# Patient Record
Sex: Male | Born: 1970 | Race: White | Hispanic: No | Marital: Single | Smoking: Former smoker
Health system: Southern US, Community
[De-identification: ages and names within clinical notes are randomized; demographics above are authoritative.]

## PROBLEM LIST (undated history)

## (undated) DIAGNOSIS — I10 Essential (primary) hypertension: Secondary | ICD-10-CM

## (undated) HISTORY — DX: Essential (primary) hypertension: I10

## (undated) HISTORY — PX: OTHER SURGICAL HISTORY: SHX169

---

## 2002-09-10 ENCOUNTER — Emergency Department (HOSPITAL_COMMUNITY): Admission: EM | Admit: 2002-09-10 | Discharge: 2002-09-10 | Payer: Self-pay | Admitting: Emergency Medicine

## 2002-09-10 ENCOUNTER — Encounter: Payer: Self-pay | Admitting: Emergency Medicine

## 2004-08-09 ENCOUNTER — Emergency Department (HOSPITAL_COMMUNITY): Admission: EM | Admit: 2004-08-09 | Discharge: 2004-08-09 | Payer: Self-pay | Admitting: Family Medicine

## 2018-01-07 ENCOUNTER — Other Ambulatory Visit: Payer: Self-pay | Admitting: Internal Medicine

## 2018-01-07 DIAGNOSIS — Z13220 Encounter for screening for lipoid disorders: Secondary | ICD-10-CM

## 2018-01-07 DIAGNOSIS — Z0000 Encounter for general adult medical examination without abnormal findings: Secondary | ICD-10-CM

## 2018-01-14 ENCOUNTER — Other Ambulatory Visit: Payer: Self-pay | Admitting: Internal Medicine

## 2018-01-16 ENCOUNTER — Telehealth: Payer: Self-pay | Admitting: Internal Medicine

## 2018-01-16 NOTE — Telephone Encounter (Signed)
Had apt 2 days ago for New pt fasting labs with upcoming CPE/ No working # in [employer]. Pt has not come for labs. CPE is scheduled for May.

## 2018-01-20 ENCOUNTER — Encounter: Payer: Self-pay | Admitting: Internal Medicine

## 2018-02-20 ENCOUNTER — Other Ambulatory Visit: Payer: Self-pay

## 2018-02-20 DIAGNOSIS — Z0000 Encounter for general adult medical examination without abnormal findings: Secondary | ICD-10-CM

## 2018-03-10 ENCOUNTER — Other Ambulatory Visit: Payer: Managed Care, Other (non HMO) | Admitting: Internal Medicine

## 2018-03-10 DIAGNOSIS — Z0000 Encounter for general adult medical examination without abnormal findings: Secondary | ICD-10-CM

## 2018-03-10 DIAGNOSIS — Z13220 Encounter for screening for lipoid disorders: Secondary | ICD-10-CM

## 2018-03-10 LAB — COMPLETE METABOLIC PANEL WITH GFR
AG Ratio: 1.5 (calc) (ref 1.0–2.5)
ALT: 29 U/L (ref 9–46)
AST: 21 U/L (ref 10–40)
Albumin: 4 g/dL (ref 3.6–5.1)
Alkaline phosphatase (APISO): 53 U/L (ref 40–115)
BUN: 23 mg/dL (ref 7–25)
CO2: 29 mmol/L (ref 20–32)
Calcium: 9.1 mg/dL (ref 8.6–10.3)
Chloride: 103 mmol/L (ref 98–110)
Creat: 1.03 mg/dL (ref 0.60–1.35)
GFR, Est African American: 100 mL/min/{1.73_m2} (ref >=60)
GFR, Est Non African American: 86 mL/min/{1.73_m2} (ref >=60)
Globulin: 2.7 g/dL (calc) (ref 1.9–3.7)
Glucose, Bld: 95 mg/dL (ref 65–99)
Potassium: 4 mmol/L (ref 3.5–5.3)
Sodium: 138 mmol/L (ref 135–146)
Total Bilirubin: 0.5 mg/dL (ref 0.2–1.2)
Total Protein: 6.7 g/dL (ref 6.1–8.1)

## 2018-03-10 LAB — LIPID PANEL
Cholesterol: 132 mg/dL (ref <200)
HDL: 44 mg/dL (ref >40)
LDL Cholesterol (Calc): 72 mg/dL (calc)
Non-HDL Cholesterol (Calc): 88 mg/dL (calc) (ref <130)
Total CHOL/HDL Ratio: 3 (calc) (ref <5.0)
Triglycerides: 78 mg/dL (ref <150)

## 2018-03-10 LAB — CBC WITH DIFFERENTIAL/PLATELET
Basophils Absolute: 51 cells/uL (ref 0–200)
Basophils Relative: 0.9 %
Eosinophils Absolute: 291 cells/uL (ref 15–500)
Eosinophils Relative: 5.1 %
HCT: 45 % (ref 38.5–50.0)
Hemoglobin: 15.7 g/dL (ref 13.2–17.1)
Lymphs Abs: 1892 cells/uL (ref 850–3900)
MCH: 30.6 pg (ref 27.0–33.0)
MCHC: 34.9 g/dL (ref 32.0–36.0)
MCV: 87.7 fL (ref 80.0–100.0)
MPV: 9.5 fL (ref 7.5–12.5)
Monocytes Relative: 8.7 %
Neutro Abs: 2970 cells/uL (ref 1500–7800)
Neutrophils Relative %: 52.1 %
Platelets: 269 10*3/uL (ref 140–400)
RBC: 5.13 10*6/uL (ref 4.20–5.80)
RDW: 12.3 % (ref 11.0–15.0)
Total Lymphocyte: 33.2 %
WBC mixed population: 496 cells/uL (ref 200–950)
WBC: 5.7 10*3/uL (ref 3.8–10.8)

## 2018-03-13 ENCOUNTER — Encounter: Payer: Self-pay | Admitting: Internal Medicine

## 2018-03-13 ENCOUNTER — Ambulatory Visit (INDEPENDENT_AMBULATORY_CARE_PROVIDER_SITE_OTHER): Payer: Managed Care, Other (non HMO) | Admitting: Internal Medicine

## 2018-03-13 VITALS — BP 150/110 | HR 81 | Ht 68.5 in | Wt 190.0 lb

## 2018-03-13 DIAGNOSIS — I10 Essential (primary) hypertension: Secondary | ICD-10-CM | POA: Diagnosis not present

## 2018-03-13 DIAGNOSIS — Z0000 Encounter for general adult medical examination without abnormal findings: Secondary | ICD-10-CM | POA: Diagnosis not present

## 2018-03-13 LAB — POCT URINALYSIS DIPSTICK
Appearance: NORMAL
Bilirubin, UA: NEGATIVE
Blood, UA: NEGATIVE
Glucose, UA: NEGATIVE
Ketones, UA: NEGATIVE
Leukocytes, UA: NEGATIVE
Nitrite, UA: NEGATIVE
Odor: NORMAL
Protein, UA: NEGATIVE
Spec Grav, UA: 1.02 (ref 1.010–1.025)
Urobilinogen, UA: 0.2 E.U./dL
pH, UA: 6 (ref 5.0–8.0)

## 2018-03-13 MED ORDER — LOSARTAN POTASSIUM-HCTZ 100-25 MG PO TABS: 1.0000 | ORAL_TABLET | Freq: Every day | ORAL | 0 refills | Status: DC

## 2018-03-13 NOTE — Progress Notes (Signed)
Subjective:    Patient ID: Nathaniel Figueroa, male    DOB: [birth year], 47 y.o.   MRN: [id number]  HPI First visit for this 47 year old White Male who has a history of hypertension which was diagnosed while he was at [facility] in 2018.  He has not had follow-up since that time.  He has been trying to get insurance coverage.  He has felt well.  He has no new complaints.  He had lab work it still good health at [facility] in March 2018 indicating he had a creatinine of 1.24 with potassium of 5.4.  He also had an ALT of 70 at the same time.  He has been maintained on Zestoretic 10/12.5 mg daily.  Prior to starting medication in February his SGOT SGPT, potassium and serum creatinine were normal in February 2018.  Lab work done through this office May 20 showed normal lipid panel, normal glucose, normal BUN and creatinine and normal liver functions.  CBC was also normal but he has not been on medication for blood pressure since his prescription ran out a while ago.  His major was in [employer]. Records state he was drinking 12 beers each weekend but he has cut back on this.  Family history: Father in his 85s who has had hypertension for years.  No family history of MI or coronary artery disease.  No diabetes mellitus or cancer.  Mother died from liver disease apparently contracted hepatitis during a blood transfusion when he was 47 years old.  He has an older sister who is healthy other than history of smoking.  He has no known drug allergies.  No history of operations.  Fracture  fingers as a child and a fractured ankle as a child.  No surgeries.  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  He is single.  Does not smoke.  Drinks beer socially.  Enjoys golf and mountain biking.  Nearest relative is his sister, Nathaniel Figueroa who lives in [place].  He has never been married and has no children.   Review of Systems  HENT: Negative.   Respiratory: Negative.    Cardiovascular: Negative.   Gastrointestinal: Negative.   Genitourinary: Negative.   Neurological: Negative.   Psychiatric/Behavioral: Negative.        Objective:   Physical Exam  Constitutional: He is oriented to person, place, and time. He appears well-developed and well-nourished.  HENT:  Head: Normocephalic and atraumatic.  Right Ear: External ear normal.  Left Ear: External ear normal.  Mouth/Throat: Oropharynx is clear and moist.  Eyes: Pupils are equal, round, and reactive to light. Conjunctivae and EOM are normal. Right eye exhibits no discharge. Left eye exhibits no discharge.  Neck: Neck supple. No JVD present. No thyromegaly present.  Cardiovascular: Normal rate, regular rhythm, normal heart sounds and intact distal pulses. Exam reveals no gallop.  No murmur heard. EKG is within normal limits  Pulmonary/Chest: Breath sounds normal. No respiratory distress. He has no wheezes. He has no rales.  Abdominal: Soft. Bowel sounds are normal. He exhibits no distension and no mass. There is no tenderness. There is no guarding.  Genitourinary:  Genitourinary Comments: No hernias to direct palpation  Musculoskeletal: He exhibits no edema.  Lymphadenopathy:    He has no cervical adenopathy.  Neurological: He is alert and oriented to person, place, and time.  Skin: Skin is warm and dry.  Psychiatric: He has a normal mood and affect. His behavior is normal. Judgment  and thought content normal.          Assessment & Plan:  Essential hypertension  BMI 28.47  Plan: Patient will start losartan 100/25 daily and will follow-up June 6 with office visit and basic metabolic panel along with blood pressure check.

## 2018-03-16 ENCOUNTER — Encounter: Payer: Self-pay | Admitting: Internal Medicine

## 2018-03-16 NOTE — Patient Instructions (Signed)
It was a pleasure to see you today.  Begin losartan HCTZ 100/25 daily and follow-up June 6 with office visit blood pressure check and lab work.

## 2018-03-18 ENCOUNTER — Encounter: Payer: Self-pay | Admitting: Internal Medicine

## 2018-03-27 ENCOUNTER — Ambulatory Visit (INDEPENDENT_AMBULATORY_CARE_PROVIDER_SITE_OTHER): Payer: Managed Care, Other (non HMO) | Admitting: Internal Medicine

## 2018-03-27 ENCOUNTER — Encounter: Payer: Self-pay | Admitting: Internal Medicine

## 2018-03-27 VITALS — BP 100/78 | HR 87 | Ht 68.5 in | Wt 190.0 lb

## 2018-03-27 DIAGNOSIS — I10 Essential (primary) hypertension: Secondary | ICD-10-CM | POA: Diagnosis not present

## 2018-03-27 LAB — BASIC METABOLIC PANEL
BUN: 16 mg/dL (ref 7–25)
CALCIUM: 9.2 mg/dL (ref 8.6–10.3)
CO2: 28 mmol/L (ref 20–32)
Chloride: 100 mmol/L (ref 98–110)
Creat: 1.05 mg/dL (ref 0.60–1.35)
Glucose, Bld: 79 mg/dL (ref 65–139)
POTASSIUM: 3.7 mmol/L (ref 3.5–5.3)
Sodium: 138 mmol/L (ref 135–146)

## 2018-03-27 MED ORDER — LOSARTAN POTASSIUM-HCTZ 100-25 MG PO TABS: 1.0000 | ORAL_TABLET | Freq: Every day | ORAL | 1 refills | Status: DC

## 2018-03-27 NOTE — Progress Notes (Signed)
   Subjective:    Patient ID: Nathaniel Figueroa, male    DOB: [birth year], 47 y.o.   MRN: [id number]  HPI 47 year old Male seen for the first time at this office on May 23 with hypertension.  Was placed on losartan HCTZ 100/25 daily and is here today for follow-up.  A basic metabolic panel was drawn this medication today.  He has no complaints or problems with this medication.  Says his father is on the same medication.    Review of Systems no new complaints     Objective:   Physical Exam  Skin warm and dry.  Nodes none.  Neck supple.  Chest clear to auscultation.  Cardiac exam regular rate and rhythm.  Extremities without edema.      Assessment & Plan:  Essential hypertension  Plan: Losartan HCTZ 100/25 daily.  Will be refilled and return in 6 months.

## 2018-03-27 NOTE — Patient Instructions (Signed)
Blood pressure stable on current dose of losartan HCTZ 100/25 daily.  Basic metabolic panel drawn.  Return in 6 months for office visit and blood pressure check.

## 2018-09-03 ENCOUNTER — Other Ambulatory Visit: Payer: Self-pay

## 2018-09-03 MED ORDER — LOSARTAN POTASSIUM-HCTZ 100-25 MG PO TABS: 1.0000 | ORAL_TABLET | Freq: Every day | ORAL | 0 refills | Status: DC

## 2018-09-26 ENCOUNTER — Encounter: Payer: Self-pay | Admitting: Internal Medicine

## 2018-09-26 ENCOUNTER — Ambulatory Visit (INDEPENDENT_AMBULATORY_CARE_PROVIDER_SITE_OTHER): Payer: Managed Care, Other (non HMO) | Admitting: Internal Medicine

## 2018-09-26 VITALS — BP 100/70 | HR 91 | Ht 68.8 in | Wt 200.0 lb

## 2018-09-26 DIAGNOSIS — I10 Essential (primary) hypertension: Secondary | ICD-10-CM

## 2018-09-26 DIAGNOSIS — Z23 Encounter for immunization: Secondary | ICD-10-CM | POA: Diagnosis not present

## 2018-09-26 MED ORDER — LOSARTAN POTASSIUM-HCTZ 100-25 MG PO TABS: 1.0000 | ORAL_TABLET | Freq: Every day | ORAL | 2 refills | Status: DC

## 2018-09-26 NOTE — Progress Notes (Signed)
   Subjective:    Patient ID: Nathaniel Figueroa, male    DOB: 02/15/1971, 47 y.o.   MRN: [id number]  HPI 47 year old Male for follow up HTN. BP normal on losartan HCTZ.  Feels well.  No new complaints.  Has gained 10 pounds.  Talked with him about diet and exercise today.    Review of Systems see above     Objective:   Physical Exam Skin warm and dry.  Nodes none.  Neck is supple.  No carotid bruits.  Chest clear to auscultation.  Cardiac exam regular rate and rhythm normal S1 and S2 without murmurs or gallops.  Extremities without edema.       Assessment & Plan:  Essential hypertension-excellent on current regimen.  B met was drawn at last visit and will not be repeated today.  He will follow-up May 2020 for physical examination.  Flu vaccine given today. 

## 2018-09-26 NOTE — Patient Instructions (Addendum)
Continue current dose of losartan HCTZ.  Flu vaccine given today.  It was a pleasure to see you today.  Follow-up May 2020.  Please try to lose some weight and get more exercise.

## 2019-03-12 ENCOUNTER — Other Ambulatory Visit: Payer: Self-pay

## 2019-03-12 ENCOUNTER — Other Ambulatory Visit: Payer: Managed Care, Other (non HMO) | Admitting: Internal Medicine

## 2019-03-12 VITALS — Temp 97.7°F

## 2019-03-12 DIAGNOSIS — Z0000 Encounter for general adult medical examination without abnormal findings: Secondary | ICD-10-CM

## 2019-03-12 DIAGNOSIS — I10 Essential (primary) hypertension: Secondary | ICD-10-CM

## 2019-03-13 LAB — CBC WITH DIFFERENTIAL/PLATELET
Absolute Monocytes: 418 cells/uL (ref 200–950)
Basophils Absolute: 41 cells/uL (ref 0–200)
Basophils Relative: 0.7 %
Eosinophils Absolute: 197 cells/uL (ref 15–500)
Eosinophils Relative: 3.4 %
HCT: 49.1 % (ref 38.5–50.0)
Hemoglobin: 16.8 g/dL (ref 13.2–17.1)
Lymphs Abs: 2007 cells/uL (ref 850–3900)
MCH: 30.7 pg (ref 27.0–33.0)
MCHC: 34.2 g/dL (ref 32.0–36.0)
MCV: 89.8 fL (ref 80.0–100.0)
MPV: 10 fL (ref 7.5–12.5)
Monocytes Relative: 7.2 %
Neutro Abs: 3138 cells/uL (ref 1500–7800)
Neutrophils Relative %: 54.1 %
Platelets: 260 10*3/uL (ref 140–400)
RBC: 5.47 10*6/uL (ref 4.20–5.80)
RDW: 12.6 % (ref 11.0–15.0)
Total Lymphocyte: 34.6 %
WBC: 5.8 10*3/uL (ref 3.8–10.8)

## 2019-03-13 LAB — LIPID PANEL
Cholesterol: 154 mg/dL (ref <200)
HDL: 51 mg/dL (ref >=40)
LDL Cholesterol (Calc): 89 mg/dL (calc)
Non-HDL Cholesterol (Calc): 103 mg/dL (calc) (ref <130)
Total CHOL/HDL Ratio: 3 (calc) (ref <5.0)
Triglycerides: 55 mg/dL (ref <150)

## 2019-03-13 LAB — COMPLETE METABOLIC PANEL WITH GFR
AG Ratio: 1.6 (calc) (ref 1.0–2.5)
ALT: 76 U/L — ABNORMAL HIGH (ref 9–46)
AST: 41 U/L — ABNORMAL HIGH (ref 10–40)
Albumin: 4.4 g/dL (ref 3.6–5.1)
Alkaline phosphatase (APISO): 46 U/L (ref 36–130)
BUN: 16 mg/dL (ref 7–25)
CO2: 28 mmol/L (ref 20–32)
Calcium: 9.5 mg/dL (ref 8.6–10.3)
Chloride: 100 mmol/L (ref 98–110)
Creat: 1.14 mg/dL (ref 0.60–1.35)
GFR, Est African American: 88 mL/min/{1.73_m2} (ref >=60)
GFR, Est Non African American: 76 mL/min/{1.73_m2} (ref >=60)
Globulin: 2.7 g/dL (calc) (ref 1.9–3.7)
Glucose, Bld: 85 mg/dL (ref 65–99)
Potassium: 4 mmol/L (ref 3.5–5.3)
Sodium: 135 mmol/L (ref 135–146)
Total Bilirubin: 0.8 mg/dL (ref 0.2–1.2)
Total Protein: 7.1 g/dL (ref 6.1–8.1)

## 2019-03-20 ENCOUNTER — Encounter: Payer: Managed Care, Other (non HMO) | Admitting: Internal Medicine

## 2019-04-17 ENCOUNTER — Telehealth: Payer: Self-pay | Admitting: Internal Medicine

## 2019-04-17 NOTE — Telephone Encounter (Signed)
The shingles are not contagious. Continue calamine for itchy spots. Can see next week if not better. ? Reschedule CPE?

## 2019-04-17 NOTE — Telephone Encounter (Signed)
Called Hines back, he verbally acknowledged understanding and will CB if he needs [place].

## 2019-04-17 NOTE — Telephone Encounter (Signed)
Nathaniel Figueroa [phone]  Nathaniel Figueroa called to say he has several spots on his back, they have been itching he has put some calamine on them and that has helped. He worked in his garden yesterday.  Nathaniel Figueroa also stated that on Monday his former girlfriend came over to visit and they sit on the porch and talked when she left he did give her a hug, that was only contact they practiced social distancing during visit except for the one hug.. On Tuesday she was diagnosed with the shingles.

## 2019-05-26 ENCOUNTER — Other Ambulatory Visit: Payer: Self-pay | Admitting: Internal Medicine

## 2019-06-19 ENCOUNTER — Other Ambulatory Visit: Payer: Managed Care, Other (non HMO) | Admitting: Internal Medicine

## 2019-06-23 ENCOUNTER — Encounter: Payer: Managed Care, Other (non HMO) | Admitting: Internal Medicine

## 2019-06-25 ENCOUNTER — Other Ambulatory Visit: Payer: Self-pay | Admitting: Internal Medicine

## 2019-07-02 ENCOUNTER — Telehealth: Payer: Self-pay | Admitting: Internal Medicine

## 2019-07-02 NOTE — Telephone Encounter (Signed)
Nathaniel Figueroa [phone]  [name] called to say he started a new job about 3 weeks ago at the [employer] center and his left shoulder is hurting, hard to go sleep for the pain and in the morning his hand feels like it is asleep with tingling, after a while gets a little better. He has been taking melatonin to try to get some sleep and ibuprofen for the pain.

## 2019-07-02 NOTE — Telephone Encounter (Signed)
Appointment scheduled with Ortho

## 2019-07-02 NOTE — Telephone Encounter (Signed)
Spoke with patient and he is going to call [facility] and try to schedule an appointment

## 2019-07-02 NOTE — Telephone Encounter (Signed)
He should see orthopedist of choice

## 2019-07-08 ENCOUNTER — Encounter: Payer: Self-pay | Admitting: Orthopaedic Surgery

## 2019-07-08 ENCOUNTER — Ambulatory Visit: Payer: Self-pay

## 2019-07-08 ENCOUNTER — Ambulatory Visit (INDEPENDENT_AMBULATORY_CARE_PROVIDER_SITE_OTHER): Payer: BC Managed Care – PPO | Admitting: Orthopaedic Surgery

## 2019-07-08 ENCOUNTER — Other Ambulatory Visit: Payer: Self-pay

## 2019-07-08 VITALS — Ht 71.0 in | Wt 194.0 lb

## 2019-07-08 DIAGNOSIS — M25512 Pain in left shoulder: Secondary | ICD-10-CM | POA: Diagnosis not present

## 2019-07-08 DIAGNOSIS — M542 Cervicalgia: Secondary | ICD-10-CM

## 2019-07-08 MED ORDER — PREDNISONE 10 MG (21) PO TBPK: ORAL_TABLET | ORAL | 0 refills | Status: DC

## 2019-07-08 MED ORDER — NAPROXEN 500 MG PO TABS: 500.0000 mg | ORAL_TABLET | Freq: Two times a day (BID) | ORAL | 3 refills | Status: DC

## 2019-07-08 NOTE — Progress Notes (Signed)
Office Visit Note   Patient: Nathaniel Figueroa           Date of Birth: 1971/08/21           MRN: [id number] Visit Date: 07/08/2019              Requested by: Nathaniel Showers, MD [address] [place],  [place] [address] PCP: Nathaniel Showers, MD   Assessment & Plan: Visit Diagnoses:  1. Neck pain   2. Acute pain of left shoulder     Plan: Impression is cervical radiculopathy with questionable bilateral carpal tunnel syndrome.  We will try prednisone Dosepak followed by 2 weeks of Naprosyn.  Patient instructed to call us back if he does not improve from this at which point we would likely obtain a cervical spine MRI with possible nerve conduction studies to evaluate for carpal tunnel syndrome.  Follow-Up Instructions: Return if symptoms worsen or fail to improve.   Orders:  Orders Placed This Encounter  Procedures  . XR Shoulder Left  . XR Cervical Spine 2 or 3 views   Meds ordered this encounter  Medications  . predniSONE (STERAPRED UNI-PAK 21 TAB) 10 MG (21) TBPK tablet    Sig: Take as directed    Dispense:  21 tablet    Refill:  0  . naproxen (NAPROSYN) 500 MG tablet    Sig: Take 1 tablet (500 mg total) by mouth 2 (two) times daily with a meal.    Dispense:  30 tablet    Refill:  3      Procedures: No procedures performed   Clinical Data: No additional findings.   Subjective: Chief Complaint  Patient presents with  . Neck - Pain  . Left Shoulder - Pain    Nathaniel Figueroa is a very pleasant 48 year old gentleman works for [employer] who comes in for evaluation of left shoulder neck and bilateral hand pain and numbness.  He states that the neck pain is in the shoulder pain are worse when he is lying down at night.  And he has woken up with both of his hands tingling.  The right arm does not hurt at all.  His left shoulder is stiff in the morning and sometimes tender.  He takes ibuprofen Motrin without significant relief.  Denies any injuries.   Review of Systems   Constitutional: Negative.   All other systems reviewed and are negative.    Objective: Vital Signs: Ht 5\' 11"  (1.803 m)   Wt 194 lb (88 kg)   BMI 27.06 kg/m   Physical Exam Vitals signs and nursing note reviewed.  Constitutional:      Appearance: He is well-developed.  HENT:     Head: Normocephalic and atraumatic.  Eyes:     Pupils: Pupils are equal, round, and reactive to light.  Neck:     Musculoskeletal: Neck supple.  Pulmonary:     Effort: Pulmonary effort is normal.  Abdominal:     Palpations: Abdomen is soft.  Musculoskeletal: Normal range of motion.  Skin:    General: Skin is warm.  Neurological:     Mental Status: He is alert and oriented to person, place, and time.  Psychiatric:        Behavior: Behavior normal.        Thought Content: Thought content normal.        Judgment: Judgment normal.     Ortho Exam Negative Sp[place]ling.  No focal motor or sensory deficits.  Normal reflexes.  Negative  carpal tunnel compressive signs.  Shoulder exam is normal. Specialty Comments:  No specialty comments available.  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  Abnormal reversal of cervical lordosis.  Xr Shoulder Left  Result Date: 07/08/2019 No acute or structural abnormalities    PMFS History: There are no active problems to display for this patient.  Past Medical History:  Diagnosis Date  . Hypertension     Family History  Problem Relation Age of Onset  . Hypertension Father   . Arthritis Sister     Past Surgical History:  Procedure Laterality Date  . broke finger     Social History   Occupational History  . Not on file  Tobacco Use  . Smoking status: Former Smoker    Types: Cigarettes  . Smokeless tobacco: Never Used  . Tobacco comment: smoked for about 5 years started at [age] and quit around 48 yrs old.   Substance and Sexual Activity  .  Alcohol use: Yes    Comment: weekly  . Drug use: Not on file  . Sexual activity: Not on file

## 2019-07-21 ENCOUNTER — Other Ambulatory Visit: Payer: Self-pay

## 2019-07-21 ENCOUNTER — Telehealth: Payer: Self-pay

## 2019-07-21 ENCOUNTER — Ambulatory Visit (INDEPENDENT_AMBULATORY_CARE_PROVIDER_SITE_OTHER): Payer: Self-pay | Admitting: Internal Medicine

## 2019-07-21 ENCOUNTER — Other Ambulatory Visit: Payer: Self-pay | Admitting: Registered"

## 2019-07-21 ENCOUNTER — Encounter: Payer: Self-pay | Admitting: Internal Medicine

## 2019-07-21 DIAGNOSIS — R059 Cough, unspecified: Secondary | ICD-10-CM

## 2019-07-21 DIAGNOSIS — R05 Cough: Secondary | ICD-10-CM

## 2019-07-21 DIAGNOSIS — Z20822 Contact with and (suspected) exposure to covid-19: Secondary | ICD-10-CM

## 2019-07-21 DIAGNOSIS — J029 Acute pharyngitis, unspecified: Secondary | ICD-10-CM

## 2019-07-21 DIAGNOSIS — J22 Unspecified acute lower respiratory infection: Secondary | ICD-10-CM

## 2019-07-21 MED ORDER — AZITHROMYCIN 250 MG PO TABS: ORAL_TABLET | ORAL | 0 refills | Status: DC

## 2019-07-21 NOTE — Telephone Encounter (Signed)
Patient called states he woke up with a cough, HA and sore throat he said two of his coworkers have been put in quarantine with the same symptoms. He said his temperature is normal. He would like to get tested for COVID, okay to schedule virtual visit?

## 2019-07-21 NOTE — Patient Instructions (Signed)
Start Zithromax Z-PAK 2 p.o. day 1 followed by 1 p.o. days 2 through 5.  Sent for COVID-19 testing.  Rest and drink plenty of fluids.  Out of work until test results are back.

## 2019-07-21 NOTE — Telephone Encounter (Signed)
Scheduled

## 2019-07-21 NOTE — Telephone Encounter (Signed)
At 12:30 pm. OK to order Covid test

## 2019-07-21 NOTE — Progress Notes (Signed)
   Subjective:    Patient ID: Nathaniel Figueroa, male    DOB: 10/22/1971, 48 y.o.   MRN: [id number]  HPI 48 year old male who started working at [employer] fairly recently because his job was terminated managing an apartment complex.  He likes his job.  Not clear if he has been exposed COVID-19.  He has had some mild respiratory infection symptoms.  He has had cough and congestion and slight sore throat.  No fever or shaking chills.  He would like to be tested for COVID-19.  We will arrange for him to go to testing center.  He has a history of hypertension.  His last physical exam was May 2019.  He has appointment for physical examination November 2020.  Due to the Coronavirus pandemic he is seen by [employer] today.  He is identified using 2 identifiers as Nathaniel Figueroa, a patient in this practice.  He is agreeable to visit in this format today.    Review of Systems no complaint of headache or dysgeusia     Objective:   Physical Exam  He is seen virtually and appears to be in no acute distress.  He is not seen to be coughing or sounding terribly nasally congested.      Assessment & Plan:  Acute lower respiratory infection  Acute pharyngitis  Possible exposure to COVID-19  Essential hypertension treated with losartan HCTZ 100/25 daily.  Physical exam scheduled for November  Plan: Arrange for COVID-19 testing at testing center on [place].  Start Zithromax Z-PAK 2 tablets day 1 followed by 1 tablet days 2 through 5.

## 2019-07-22 LAB — NOVEL CORONAVIRUS, NAA: SARS-CoV-2, NAA: NOT DETECTED

## 2019-07-23 ENCOUNTER — Telehealth: Payer: Self-pay

## 2019-07-23 NOTE — Telephone Encounter (Signed)
Patient called his job is requesting a letter for missing work on 9/29 bc he had COVID symptoms, COVID test was negative but his job wants him out for two weeks to self quarantine. He needs this letter so he can get paid.

## 2019-07-24 NOTE — Telephone Encounter (Signed)
Letter written and ready to be picked up.

## 2019-07-24 NOTE — Telephone Encounter (Signed)
Called and left message.

## 2019-07-28 ENCOUNTER — Other Ambulatory Visit: Payer: Self-pay | Admitting: Internal Medicine

## 2019-08-27 ENCOUNTER — Other Ambulatory Visit: Payer: Self-pay | Admitting: Internal Medicine

## 2019-09-02 ENCOUNTER — Other Ambulatory Visit: Payer: Self-pay | Admitting: Orthopaedic Surgery

## 2019-09-10 ENCOUNTER — Other Ambulatory Visit: Payer: Self-pay

## 2019-09-10 ENCOUNTER — Other Ambulatory Visit: Payer: BC Managed Care – PPO | Admitting: Internal Medicine

## 2019-09-10 DIAGNOSIS — R7989 Other specified abnormal findings of blood chemistry: Secondary | ICD-10-CM

## 2019-09-10 DIAGNOSIS — I10 Essential (primary) hypertension: Secondary | ICD-10-CM

## 2019-09-10 DIAGNOSIS — Z13220 Encounter for screening for lipoid disorders: Secondary | ICD-10-CM

## 2019-09-10 DIAGNOSIS — Z0000 Encounter for general adult medical examination without abnormal findings: Secondary | ICD-10-CM

## 2019-09-10 LAB — CBC WITH DIFFERENTIAL/PLATELET
Absolute Monocytes: 378 cells/uL (ref 200–950)
Basophils Absolute: 70 cells/uL (ref 0–200)
Basophils Relative: 1.3 %
Eosinophils Absolute: 470 cells/uL (ref 15–500)
Eosinophils Relative: 8.7 %
HCT: 48.7 % (ref 38.5–50.0)
Hemoglobin: 16.5 g/dL (ref 13.2–17.1)
Lymphs Abs: 1906 cells/uL (ref 850–3900)
MCH: 30.6 pg (ref 27.0–33.0)
MCHC: 33.9 g/dL (ref 32.0–36.0)
MCV: 90.2 fL (ref 80.0–100.0)
MPV: 9.9 fL (ref 7.5–12.5)
Monocytes Relative: 7 %
Neutro Abs: 2576 cells/uL (ref 1500–7800)
Neutrophils Relative %: 47.7 %
Platelets: 300 10*3/uL (ref 140–400)
RBC: 5.4 10*6/uL (ref 4.20–5.80)
RDW: 12.1 % (ref 11.0–15.0)
Total Lymphocyte: 35.3 %
WBC: 5.4 10*3/uL (ref 3.8–10.8)

## 2019-09-10 LAB — COMPLETE METABOLIC PANEL WITH GFR
AG Ratio: 1.6 (calc) (ref 1.0–2.5)
ALT: 36 U/L (ref 9–46)
AST: 22 U/L (ref 10–40)
Albumin: 4.2 g/dL (ref 3.6–5.1)
Alkaline phosphatase (APISO): 47 U/L (ref 36–130)
BUN/Creatinine Ratio: 27 (calc) — ABNORMAL HIGH (ref 6–22)
BUN: 27 mg/dL — ABNORMAL HIGH (ref 7–25)
CO2: 25 mmol/L (ref 20–32)
Calcium: 9.2 mg/dL (ref 8.6–10.3)
Chloride: 100 mmol/L (ref 98–110)
Creat: 0.99 mg/dL (ref 0.60–1.35)
GFR, Est African American: 103 mL/min/{1.73_m2} (ref >=60)
GFR, Est Non African American: 89 mL/min/{1.73_m2} (ref >=60)
Globulin: 2.7 g/dL (calc) (ref 1.9–3.7)
Glucose, Bld: 90 mg/dL (ref 65–99)
Potassium: 4 mmol/L (ref 3.5–5.3)
Sodium: 136 mmol/L (ref 135–146)
Total Bilirubin: 0.7 mg/dL (ref 0.2–1.2)
Total Protein: 6.9 g/dL (ref 6.1–8.1)

## 2019-09-10 LAB — LIPID PANEL
Cholesterol: 147 mg/dL (ref <200)
HDL: 52 mg/dL (ref >=40)
LDL Cholesterol (Calc): 84 mg/dL (calc)
Non-HDL Cholesterol (Calc): 95 mg/dL (calc) (ref <130)
Total CHOL/HDL Ratio: 2.8 (calc) (ref <5.0)
Triglycerides: 37 mg/dL (ref <150)

## 2019-09-15 ENCOUNTER — Encounter: Payer: Self-pay | Admitting: Internal Medicine

## 2019-09-15 ENCOUNTER — Other Ambulatory Visit: Payer: Self-pay

## 2019-09-15 ENCOUNTER — Ambulatory Visit (INDEPENDENT_AMBULATORY_CARE_PROVIDER_SITE_OTHER): Payer: BC Managed Care – PPO | Admitting: Internal Medicine

## 2019-09-15 VITALS — BP 100/80 | HR 67 | Temp 98.3°F | Ht 71.0 in | Wt 197.0 lb

## 2019-09-15 DIAGNOSIS — Z0000 Encounter for general adult medical examination without abnormal findings: Secondary | ICD-10-CM

## 2019-09-15 DIAGNOSIS — I10 Essential (primary) hypertension: Secondary | ICD-10-CM

## 2019-09-15 DIAGNOSIS — Z23 Encounter for immunization: Secondary | ICD-10-CM

## 2019-09-15 NOTE — Patient Instructions (Signed)
It was a pleasure to see you today. Flu vaccine given. RTC in one year or as needed. 

## 2019-09-15 NOTE — Progress Notes (Signed)
   Subjective:    Patient ID: Nathaniel Figueroa, male    DOB: [birth year], 48 y.o.   MRN: [id number]  HPI 48 year old Male for health maintenance exam and evaluation of medical issues.   History of hypertension maintained on Zestoretic 10/12.5 mg daily.  He is now working at [employer].  Likes the job and thinks it has been potential for advancement.  Previously worked as a Nurse, learning disability for an apartment complex.  Doing well during the pandemic.  Has had some Covid exposure at work but  tested negative  in September.  Feels that workplace does a good job isolating outbreaks.  Social history: Does not smoke.  Drinks beer socially.  He is single.  Enjoys golf and mountain biking.  His nearest relative is his sister, Nathaniel Figueroa who lives in [place].  Has never been married and has no children.  No known drug allergies.  No history of operations.  Had fractured fingers as a child and a fractured ankle as a child.  Family history: Father in his 31s who has had hypertension for years.  No family history of MI or coronary disease.  No family history of diabetes or cancer.  Mother died from liver disease as she apparently contracted hepatitis during the blood transfusion when she was 48 years old.  He has an older sister who is healthy other than history of smoking.  Fasting labs reviewed including CBC, C met, lipid panel and are normal.  Flu vaccine given.      Review of Systems no new complaints feels well     Objective:   Physical Exam BP 100/80, pulse 67, T 98.3 degrees, Weight 197 pounds, BMI 27.48   Has lost 3 pounds.  Skin warm and dry.  Nodes none.  TMs clear.  Neck supple.  No JVD or thyromegaly.  No carotid bruits.  Chest clear to auscultation.  Cardiac exam regular rate and rhythm normal S1 and S2 without murmurs.  Abdomen no hepatosplenomegaly masses or tenderness.  Deferred prostate exam until next year.  No lower extremity edema or deformity.  Not checked for hernias this  time.       Assessment & Plan:  Essential hypertension stable on current regimen.  Blood pressure excellent at 180  BMI 27.48-gets a lot of exercise at work.  Watch diet.  Plan: Continue current medications and return in 1 year or as needed.

## 2019-09-16 ENCOUNTER — Other Ambulatory Visit: Payer: Self-pay

## 2019-09-16 MED ORDER — LOSARTAN POTASSIUM-HCTZ 100-25 MG PO TABS: 1.0000 | ORAL_TABLET | Freq: Every day | ORAL | 0 refills | Status: DC

## 2019-12-11 ENCOUNTER — Telehealth: Payer: Self-pay | Admitting: Internal Medicine

## 2019-12-11 MED ORDER — LOSARTAN POTASSIUM-HCTZ 100-25 MG PO TABS: 1.0000 | ORAL_TABLET | Freq: Every day | ORAL | 2 refills | Status: DC

## 2019-12-11 NOTE — Telephone Encounter (Signed)
Received Fax RX request from  Pharmacy -  [id number] Ginette Otto, [place] - [address] AT [facility] [address] [facility] [address] Phone:  [phone]  Fax:  [phone]       Medication - losartan-hydrochlorothiazide (HYZAAR) 100-25 MG tablet   Last Refill - 11/13/2019  Last OV - 09/15/19  Last CPE - 09/15/19  Next Appointment - 09/23/20

## 2020-06-02 ENCOUNTER — Telehealth: Payer: Self-pay | Admitting: Internal Medicine

## 2020-06-02 NOTE — Telephone Encounter (Addendum)
When I called to confirm pharmacy, with Leonette Most he let me know that he has lost a lot of weight and that his blood pressure has been running low. So I scheduled him for an appointment next Friday, in the meantime he is going to be logging blood pressure readings. He will first take 3 times a day over the weekend and call on Monday with readings.   Received Fax RX request from  Pharmacy [id number] [address] [place], [place] [address] Al Corpus [phone] FAX: [phone] Phone [phone]  Medication - losartan-hydrochlorothiazide [facility]) 100-25 MG tablet   Last Refill -   Last OV - 09/15/19  Last CPE - 09/15/19  Next Appointment - 06/10/20  Next CPE - 09/23/2020

## 2020-06-02 NOTE — Telephone Encounter (Signed)
Do not refill until seen

## 2020-06-06 NOTE — Telephone Encounter (Signed)
Nathaniel Figueroa called with BP readings, he will continue to get readings, I ask him to take 3 times a day at different time and bring to office visit on Friday.  06/06/20 129/81   2:07 pm 111/70  1:59 pm  06/05/20 Took BP medication at noon 133/88  1:34 pm 142/84  1:33 pm  06/04/20 Forgot to take BP readings  06/03/20 Took BP medication ay 8: 00 am 97/61  9:29 am 112/67  9:27 am  06/02/20 Took BP medication at 1:30pm  108/71  3:01 pm

## 2020-06-06 NOTE — Telephone Encounter (Signed)
Have reviewed these readings. Patient has appt later this week.

## 2020-06-10 ENCOUNTER — Ambulatory Visit: Payer: BC Managed Care – PPO | Admitting: Internal Medicine

## 2020-06-16 NOTE — Telephone Encounter (Signed)
Canceled appointment, LVM to call office to reschedule for next week.

## 2020-06-16 NOTE — Telephone Encounter (Signed)
Nathaniel Figueroa called to say on Saturday he was exposed to Friend that tested positive for COVID on Tuesday, he is not having any symptoms at this time. Should we change his appointment to virtual as an abundance of caution.  Blood Pressure readings He takes his medication at 2:00 pm when he gets up for the day.  06/12/2020   3:20 pm  118/90 before work   3:09 am  115/97 after work  06/13/2020 4:18 pm  118/75   3:00 am 115/83  06/14/2020 4:00 pm  118/77   3:00 am 118/73  06/15/2020 4:00 am 114/76   3:30 am 127/86

## 2020-06-16 NOTE — Telephone Encounter (Signed)
I want to see him in person. It has been awhile since he was seen. Postpone visit one week and he is to let us know if he develops Covid symptoms.

## 2020-06-17 ENCOUNTER — Ambulatory Visit: Payer: BC Managed Care – PPO | Admitting: Internal Medicine

## 2020-06-17 NOTE — Telephone Encounter (Signed)
Patient called back and scheduled appointment

## 2020-06-23 ENCOUNTER — Encounter: Payer: Self-pay | Admitting: Internal Medicine

## 2020-06-23 ENCOUNTER — Ambulatory Visit (INDEPENDENT_AMBULATORY_CARE_PROVIDER_SITE_OTHER): Payer: BC Managed Care – PPO | Admitting: Internal Medicine

## 2020-06-23 ENCOUNTER — Other Ambulatory Visit: Payer: Self-pay

## 2020-06-23 VITALS — BP 90/60 | HR 88 | Ht 71.0 in | Wt 176.0 lb

## 2020-06-23 DIAGNOSIS — R031 Nonspecific low blood-pressure reading: Secondary | ICD-10-CM | POA: Diagnosis not present

## 2020-06-23 DIAGNOSIS — I10 Essential (primary) hypertension: Secondary | ICD-10-CM

## 2020-06-23 MED ORDER — LOSARTAN POTASSIUM 50 MG PO TABS: 50.0000 mg | ORAL_TABLET | Freq: Every day | ORAL | 0 refills | Status: DC

## 2020-06-23 NOTE — Patient Instructions (Addendum)
Change Losartan HCTZ 100/25 to Losartan 50 mg daily. Have flu vaccine this season. CPE booked for December

## 2020-06-23 NOTE — Progress Notes (Signed)
   Subjective:    Patient ID: Illene Figueroa, male    DOB: 1971-07-02, 49 y.o.   MRN: [id number]  HPI 49 year old Male in today for follow-up on hypertension.  He works at [employer] and does a lot of walking.  He had health maintenance exam here November 2020.  Has noticed recently that his blood pressure is low.  Wonders if he can reduce his blood pressure medication.  Has been maintained for several years on losartan HCTZ 100/25 daily since establishing here in 2019.  Was diagnosed with hypertension at [facility] in 2018.  Was initially placed on Zestoretic 10/12.5 mg daily.  He established here in 2019.  At that time he had been drinking 12 beers every weekend but he cut back on that.  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  He is single and does not smoke.  Does not drink alcohol nail.  Nearest relative is his sister who lives in [place].  He has never been married and has no children.    Review of Systems no complaints     Objective:   Physical Exam Blood pressure today is 90/60.  He is asymptomatic with the lower blood pressure.  Pulse 88 regular pulse oximetry 97% weight 176 pounds BMI 24.55 (he weighed 190 pounds in 2019)  Skin warm and dry.  No cervical adenopathy.  No carotid bruits.  Chest is clear to auscultation.  Cardiac exam regular rate and rhythm normal S1 and S2 without murmurs or gallops.  No lower extremity edema.       Assessment & Plan:  Weight loss due to cutting out alcohol consumption is having a very active job at [employer] where he walks a number of miles a day.  He is actually hypotensive today and I think we can decrease his antihypertensive medication as a result of his weight loss and increased activity.  He probably does not even need the diuretic at this point.  We are going to try losartan 50 mg daily.  He has appointment for physical exam and fasting labs here in December.  He will let me know if blood pressure  is not staying under control on this regimen.

## 2020-06-28 ENCOUNTER — Telehealth: Payer: Self-pay | Admitting: Internal Medicine

## 2020-06-28 NOTE — Telephone Encounter (Signed)
[facility] calling to see if the losartan that was sent in 06/23/20 was replacing the one combo one they had on file. CB: [phone] ext [phone]

## 2020-06-28 NOTE — Telephone Encounter (Signed)
Called and clarified order to them. MJB,MD

## 2020-07-08 ENCOUNTER — Other Ambulatory Visit: Payer: Self-pay | Admitting: Internal Medicine

## 2020-07-08 NOTE — Telephone Encounter (Signed)
Left voicemail, we sent 30 days on 9/02

## 2020-07-08 NOTE — Telephone Encounter (Signed)
He said he got that refill on 06/23/20, he said he does not need that refill right now.

## 2020-07-08 NOTE — Telephone Encounter (Signed)
We sent mail order at his visit. Does he need this too?

## 2020-09-22 ENCOUNTER — Other Ambulatory Visit: Payer: BC Managed Care – PPO | Admitting: Internal Medicine

## 2020-09-23 ENCOUNTER — Encounter: Payer: BC Managed Care – PPO | Admitting: Internal Medicine

## 2020-10-24 ENCOUNTER — Telehealth: Payer: Self-pay | Admitting: Internal Medicine

## 2020-10-24 ENCOUNTER — Other Ambulatory Visit: Payer: Self-pay

## 2020-10-24 ENCOUNTER — Encounter: Payer: Self-pay | Admitting: Internal Medicine

## 2020-10-24 ENCOUNTER — Ambulatory Visit (INDEPENDENT_AMBULATORY_CARE_PROVIDER_SITE_OTHER): Payer: BC Managed Care – PPO | Admitting: Internal Medicine

## 2020-10-24 VITALS — HR 69 | Temp 98.5°F

## 2020-10-24 DIAGNOSIS — R059 Cough, unspecified: Secondary | ICD-10-CM | POA: Diagnosis not present

## 2020-10-24 DIAGNOSIS — J22 Unspecified acute lower respiratory infection: Secondary | ICD-10-CM | POA: Diagnosis not present

## 2020-10-24 DIAGNOSIS — I10 Essential (primary) hypertension: Secondary | ICD-10-CM

## 2020-10-24 DIAGNOSIS — Z20822 Contact with and (suspected) exposure to covid-19: Secondary | ICD-10-CM

## 2020-10-24 MED ORDER — AZITHROMYCIN 250 MG PO TABS: ORAL_TABLET | ORAL | 0 refills | Status: DC

## 2020-10-24 MED ORDER — ONDANSETRON HCL 4 MG PO TABS: 4.0000 mg | ORAL_TABLET | Freq: Three times a day (TID) | ORAL | 0 refills | Status: DC | PRN

## 2020-10-24 NOTE — Telephone Encounter (Signed)
Nathaniel Figueroa [phone]  [name] called to say he has runny nose, headache comes and goes, nausea last night, cough, no fever, possible COVID exposure at work, or Girlfriend, has had COVID vaccine no Booster yet.

## 2020-10-24 NOTE — Progress Notes (Signed)
   Subjective:    Patient ID: Nathaniel Figueroa, male    DOB: Jul 20, 1971, 50 y.o.   MRN: [id number]  HPI 50 year old Male not vaccinated for Covid-19 seen today with complaint of cough as well as some mild nausea.  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  May have been exposed to COVID-19 from his girlfriend whose daughter has had COVID-19.  Patient denies fever or shaking chills.  No dysgeusia.  No myalgias.  No vomiting but has had some mild nausea.  Patient has a history of hypertension well-controlled on current regimen.  Review of Systems see above     Objective:   Physical Exam Temperature 98.5 degrees pulse 69 regular pulse oximetry 97% on room air TMs are clear.  Neck is supple.  Chest is clear to auscultation without rales or wheezing.  He is in no acute distress.      Assessment & Plan:  Acute lower respiratory infection with cough only.  Nausea-etiology unclear  Has not been vaccinated for COVID-19 and may have had COVID-19 exposure through girlfriend who has daughter with COVID-19.  Plan: COVID-19 test obtained.  Results pending.  Treat with Zithromax Z-PAK  2 tabs p.o. day 1 followed by 1 tab p.o. days 2 through 5.  Zofran 4 mg tablets 1 every 8 hours if needed for nausea.  Out of work until [employer] result is back.  Rest and drink plenty of fluids.

## 2020-10-24 NOTE — Patient Instructions (Addendum)
Take Zithromax Z-PAK 2 tablets day 1 followed by 1 p.o. days 2 through 5.  Rest and drink fluids.  Take Zofran as needed for nausea every 8 hours.  Continue with antihypertensive medication.  Out of work until test result for Covid is back.

## 2020-10-24 NOTE — Telephone Encounter (Signed)
After speaking with Dr Lenord Fellers schedule Lobby Visit due to weather.

## 2020-10-26 LAB — SARS-COV-2 RNA,(COVID-19) QUALITATIVE NAAT: SARS CoV2 RNA: NOT DETECTED

## 2020-10-27 ENCOUNTER — Other Ambulatory Visit: Payer: Self-pay

## 2020-11-25 ENCOUNTER — Other Ambulatory Visit: Payer: Self-pay

## 2020-11-25 MED ORDER — LOSARTAN POTASSIUM 50 MG PO TABS: 50.0000 mg | ORAL_TABLET | Freq: Every day | ORAL | 0 refills | Status: DC

## 2021-03-03 ENCOUNTER — Other Ambulatory Visit: Payer: Self-pay | Admitting: Internal Medicine

## 2021-03-07 NOTE — Telephone Encounter (Signed)
Pt called back and needs either a Thursday or Friday based on his work schedule, I scheduled him for the next available which is 05/19/21

## 2021-04-26 ENCOUNTER — Emergency Department
Admission: RE | Admit: 2021-04-26 | Discharge: 2021-04-26 | Disposition: A | Discharge status: Home / Self Care | Payer: BC Managed Care – PPO | Source: Ambulatory Visit

## 2021-04-26 ENCOUNTER — Emergency Department (INDEPENDENT_AMBULATORY_CARE_PROVIDER_SITE_OTHER): Payer: BC Managed Care – PPO

## 2021-04-26 ENCOUNTER — Other Ambulatory Visit: Payer: Self-pay

## 2021-04-26 VITALS — BP 133/87 | HR 92 | Temp 98.7°F | Resp 16 | Ht 71.0 in | Wt 182.0 lb

## 2021-04-26 DIAGNOSIS — S86912A Strain of unspecified muscle(s) and tendon(s) at lower leg level, left leg, initial encounter: Secondary | ICD-10-CM

## 2021-04-26 DIAGNOSIS — M7989 Other specified soft tissue disorders: Secondary | ICD-10-CM | POA: Diagnosis not present

## 2021-04-26 DIAGNOSIS — M25562 Pain in left knee: Secondary | ICD-10-CM | POA: Diagnosis not present

## 2021-04-26 MED ORDER — DICLOFENAC SODIUM 75 MG PO TBEC: 75.0000 mg | DELAYED_RELEASE_TABLET | Freq: Two times a day (BID) | ORAL | 0 refills | Status: AC

## 2021-04-26 MED ORDER — BACLOFEN 10 MG PO TABS: 10.0000 mg | ORAL_TABLET | Freq: Three times a day (TID) | ORAL | 0 refills | Status: DC

## 2021-04-26 NOTE — ED Triage Notes (Signed)
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

## 2021-04-26 NOTE — Discharge Instructions (Addendum)
Advised patient to take medication with food as directed for the next 2 weeks.  Advised patient not to use any other OTC NSAIDs with Diclofenac.  Advised patient may RICE left knee 20 minutes 3 times daily for the next 3 to 5 days.  Advised/encouraged patient to take 1 week off from work to allow left knee to heal from apparent left knee strain.  Encourage patient to avoid lifting and or repetitive motions involving left knee for the next 7 to 10 days.  Advised patient if symptoms worsen and/or unresolved please follow-up with PCP or here for further evaluation.  Work note provided prior to discharge.

## 2021-04-26 NOTE — ED Provider Notes (Signed)
[facility]    CSN: [id number] Arrival date & time: 04/26/21  1522      History   Chief Complaint Chief Complaint  Patient presents with   Knee Pain    Left     HPI Nathaniel Figueroa is a 50 y.o. male.   HPI 50 year old male presents with pain and swelling of left knee for 2 weeks.  Denies injury or insult to left knee, reports walking on hard concrete floors for [employer].  Patient reports using either OTC Aleve 220 mg 1-2 tabs daily for the past 2 weeks which helps left knee pain somewhat.  Additionally, patient reports icing left knee each night after his shift which also helps alleviate pain and swelling of left knee.  Past Medical History:  Diagnosis Date   Hypertension     There are no problems to display for this patient.   Past Surgical History:  Procedure Laterality Date   broke finger         Home Medications    Prior to Admission medications   Medication Sig Start Date End Date Taking? Authorizing Provider  baclofen (LIORESAL) 10 MG tablet Take 1 tablet (10 mg total) by mouth 3 (three) times daily. 04/26/21  Yes Trevor Iha, FNP  diclofenac (VOLTAREN) 75 MG EC tablet Take 1 tablet (75 mg total) by mouth 2 (two) times daily for 10 days. 04/26/21 05/06/21 Yes Trevor Iha, FNP  azithromycin (ZITHROMAX) 250 MG tablet 2 tabs day 1 followed by one tab days 2-5 Patient not taking: Reported on 04/26/2021 10/24/20   Margaree Mackintosh, MD  losartan (COZAAR) 50 MG tablet TAKE 1 TABLET(50 MG) BY MOUTH DAILY 03/07/21   Margaree Mackintosh, MD  ondansetron (ZOFRAN) 4 MG tablet Take 1 tablet (4 mg total) by mouth every 8 (eight) hours as needed for nausea or vomiting. Patient not taking: Reported on 04/26/2021 10/24/20   Margaree Mackintosh, MD    Family History Family History  Problem Relation Age of Onset   Hepatitis Mother    Hypertension Father    Arthritis Sister     Social History Social History   Tobacco Use   Smoking status: Former    Pack years: 0.00    Types:  Cigarettes   Smokeless tobacco: Never   Tobacco comments:    smoked for about 5 years started at 59 and quit around 50 yrs old.   Substance Use Topics   Alcohol use: Yes    Alcohol/week: 2.0 standard drinks    Types: 2 Cans of beer per week   Drug use: Never     Allergies   Patient has no known allergies.   Review of Systems Review of Systems  Musculoskeletal:        Pain and swelling of left knee x 2 weeks  All other systems reviewed and are negative.   Physical Exam Triage Vital Signs ED Triage Vitals  Enc Vitals Group     BP 04/26/21 1541 133/87     Pulse Rate 04/26/21 1541 92     Resp 04/26/21 1541 16     Temp 04/26/21 1541 98.7 F (37.1 C)     Temp Source 04/26/21 1541 Oral     SpO2 04/26/21 1541 98 %     Weight 04/26/21 1543 182 lb (82.6 kg)     Height 04/26/21 1543 5\' 11"  (1.803 m)     Head Circumference --      Peak Flow --  Pain Score 04/26/21 1542 4     Pain Loc --      Pain Edu? --      Excl. in [place]? --    No data found.  Updated Vital Signs BP 133/87 (BP Location: Right Arm)   Pulse 92   Temp 98.7 F (37.1 C) (Oral)   Resp 16   Ht 5\' 11"  (1.803 m)   Wt 182 lb (82.6 kg)   SpO2 98%   BMI 25.38 kg/m    Physical Exam Vitals and nursing note reviewed.  Constitutional:      General: He is not in acute distress.    Appearance: Normal appearance. He is normal weight. He is not ill-appearing.  HENT:     Head: Normocephalic and atraumatic.     Mouth/Throat:     Mouth: Mucous membranes are moist.     Pharynx: Oropharynx is clear.  Eyes:     Extraocular Movements: Extraocular movements intact.     Conjunctiva/sclera: Conjunctivae normal.     Pupils: Pupils are equal, round, and reactive to light.  Cardiovascular:     Rate and Rhythm: Normal rate and regular rhythm.     Pulses: Normal pulses.     Heart sounds: Normal heart sounds.  Pulmonary:     Effort: Pulmonary effort is normal.     Breath sounds: Normal breath sounds.     Comments:  No adventitious breath sounds noted Musculoskeletal:        General: Normal range of motion.     Cervical back: Normal range of motion and neck supple. No tenderness.     Comments: LEFT knee: TTP over superior medial aspect of patella, no moderate soft tissue swelling noticed over inferior vastus medialis secondary to small quadriceps tendon enthesophyte; negative Lachman's, negative anterior drawer, mild pain elicited with minimal varus/valgus manipulation.  Lymphadenopathy:     Cervical: No cervical adenopathy.  Skin:    General: Skin is warm and dry.  Neurological:     Mental Status: He is alert.     UC Treatments / Results  Labs (all labs ordered are listed, but only abnormal results are displayed) Labs Reviewed - No data to display  EKG   Radiology DG Knee Complete 4 Views Left  Result Date: 04/26/2021 CLINICAL DATA:  Left knee pain and swelling for 2 weeks. EXAM: LEFT KNEE - COMPLETE 4+ VIEW COMPARISON:  None. FINDINGS: No evidence of fracture, dislocation, or joint effusion. Small quadriceps tendon enthesophyte. Trace inferior patellar spurring and spurring of the tibial spines. Soft tissues are unremarkable. IMPRESSION: 1. No acute abnormality. 2. Trace degenerative inferior patellar spurring and spurring of the tibial spines. Electronically Signed   By: 06/27/2021 M.D.   On: 04/26/2021 16:34    Procedures Procedures (including critical care time)  Medications Ordered in UC Medications - No data to display  Initial Impression / Assessment and Plan / UC Course  I have reviewed the triage vital signs and the nursing notes.  Pertinent labs & imaging results that were available during my care of the patient were reviewed by me and considered in my medical decision making (see chart for details).    MDM: 1.  Acute left knee pain-Rx Diclofenac and have written patient out of work for the past week to rest left knee; 2.  Strain of left knee, initial encounter-Rx'd  Baclofen.  Advised patient if symptoms worsen and/or unresolved please follow-up with orthopedic provider (contact information provided on AVS today). Patient discharged home,  hemodynamically stable.  Final Clinical Impressions(s) / UC Diagnoses   Final diagnoses:  Acute pain of left knee  Strain of left knee, initial encounter     Discharge Instructions      Advised patient to take medication with food as directed for the next 2 weeks.  Advised patient not to use any other OTC NSAIDs with Diclofenac.  Advised patient may RICE left knee 20 minutes 3 times daily for the next 3 to 5 days.  Advised/encouraged patient to take 1 week off from work to allow left knee to heal from apparent left knee strain.  Encourage patient to avoid lifting and or repetitive motions involving left knee for the next 7 to 10 days.  Advised patient if symptoms worsen and/or unresolved please follow-up with PCP or here for further evaluation.  Work note provided prior to discharge.     ED Prescriptions     Medication Sig Dispense Auth. Provider   diclofenac (VOLTAREN) 75 MG EC tablet Take 1 tablet (75 mg total) by mouth 2 (two) times daily for 10 days. 20 tablet Trevor Iha, FNP   baclofen (LIORESAL) 10 MG tablet Take 1 tablet (10 mg total) by mouth 3 (three) times daily. 30 each Trevor Iha, FNP      PDMP not reviewed this encounter.   Trevor Iha, FNP 04/26/21 1818

## 2021-05-18 ENCOUNTER — Other Ambulatory Visit: Payer: Self-pay

## 2021-05-18 ENCOUNTER — Other Ambulatory Visit: Payer: BC Managed Care – PPO | Admitting: Internal Medicine

## 2021-05-18 DIAGNOSIS — Z0000 Encounter for general adult medical examination without abnormal findings: Secondary | ICD-10-CM

## 2021-05-18 DIAGNOSIS — I10 Essential (primary) hypertension: Secondary | ICD-10-CM

## 2021-05-19 ENCOUNTER — Ambulatory Visit (INDEPENDENT_AMBULATORY_CARE_PROVIDER_SITE_OTHER): Payer: BC Managed Care – PPO | Admitting: Internal Medicine

## 2021-05-19 ENCOUNTER — Encounter: Payer: Self-pay | Admitting: Internal Medicine

## 2021-05-19 VITALS — BP 140/90 | HR 87 | Ht 71.0 in | Wt 189.0 lb

## 2021-05-19 DIAGNOSIS — Z0000 Encounter for general adult medical examination without abnormal findings: Secondary | ICD-10-CM

## 2021-05-19 DIAGNOSIS — M25562 Pain in left knee: Secondary | ICD-10-CM

## 2021-05-19 DIAGNOSIS — I10 Essential (primary) hypertension: Secondary | ICD-10-CM

## 2021-05-19 LAB — CBC WITH DIFFERENTIAL/PLATELET
Absolute Monocytes: 390 cells/uL (ref 200–950)
Basophils Absolute: 70 cells/uL (ref 0–200)
Basophils Relative: 1.4 %
Eosinophils Absolute: 190 cells/uL (ref 15–500)
Eosinophils Relative: 3.8 %
HCT: 47.5 % (ref 38.5–50.0)
Hemoglobin: 16 g/dL (ref 13.2–17.1)
Lymphs Abs: 2015 cells/uL (ref 850–3900)
MCH: 29.4 pg (ref 27.0–33.0)
MCHC: 33.7 g/dL (ref 32.0–36.0)
MCV: 87.3 fL (ref 80.0–100.0)
MPV: 9.6 fL (ref 7.5–12.5)
Monocytes Relative: 7.8 %
Neutro Abs: 2335 cells/uL (ref 1500–7800)
Neutrophils Relative %: 46.7 %
Platelets: 343 10*3/uL (ref 140–400)
RBC: 5.44 10*6/uL (ref 4.20–5.80)
RDW: 13.1 % (ref 11.0–15.0)
Total Lymphocyte: 40.3 %
WBC: 5 10*3/uL (ref 3.8–10.8)

## 2021-05-19 LAB — COMPLETE METABOLIC PANEL WITH GFR
AG Ratio: 1.6 (calc) (ref 1.0–2.5)
ALT: 35 U/L (ref 9–46)
AST: 24 U/L (ref 10–35)
Albumin: 4.2 g/dL (ref 3.6–5.1)
Alkaline phosphatase (APISO): 56 U/L (ref 35–144)
BUN: 16 mg/dL (ref 7–25)
CO2: 27 mmol/L (ref 20–32)
Calcium: 9.4 mg/dL (ref 8.6–10.3)
Chloride: 104 mmol/L (ref 98–110)
Creat: 1.06 mg/dL (ref 0.70–1.30)
Globulin: 2.7 g/dL (calc) (ref 1.9–3.7)
Glucose, Bld: 86 mg/dL (ref 65–99)
Potassium: 4.4 mmol/L (ref 3.5–5.3)
Sodium: 139 mmol/L (ref 135–146)
Total Bilirubin: 0.7 mg/dL (ref 0.2–1.2)
Total Protein: 6.9 g/dL (ref 6.1–8.1)
eGFR: 85 mL/min/{1.73_m2} (ref >=60)

## 2021-05-19 LAB — POCT URINALYSIS DIPSTICK
Appearance: NEGATIVE
Bilirubin, UA: NEGATIVE
Blood, UA: NEGATIVE
Glucose, UA: NEGATIVE
Ketones, UA: NEGATIVE
Leukocytes, UA: NEGATIVE
Nitrite, UA: NEGATIVE
Odor: NEGATIVE
Protein, UA: NEGATIVE
Spec Grav, UA: 1.01 (ref 1.010–1.025)
Urobilinogen, UA: 0.2 E.U./dL
pH, UA: 6.5 (ref 5.0–8.0)

## 2021-05-19 LAB — LIPID PANEL
Cholesterol: 129 mg/dL (ref <200)
HDL: 43 mg/dL (ref >=40)
LDL Cholesterol (Calc): 76 mg/dL (calc)
Non-HDL Cholesterol (Calc): 86 mg/dL (calc) (ref <130)
Total CHOL/HDL Ratio: 3 (calc) (ref <5.0)
Triglycerides: 36 mg/dL (ref <150)

## 2021-05-19 LAB — PSA: PSA: 1.25 ng/mL (ref <=4.00)

## 2021-05-19 MED ORDER — MELOXICAM 15 MG PO TABS: 15.0000 mg | ORAL_TABLET | Freq: Every day | ORAL | 2 refills | Status: DC

## 2021-05-19 NOTE — Patient Instructions (Addendum)
It was a pleasure to see you today. Labs are normal. You are being referred to [facility] for left knee pain. Monitor BP and call with readings. Ice knee down after work. Try Meloxicam 15 mg daily.  Need to consider colonoscopy since you are now 50 years old.

## 2021-05-19 NOTE — Progress Notes (Signed)
   Subjective:    Patient ID: Nathaniel Figueroa, male    DOB: [birth year], 50 y.o.   MRN: [id number]  HPI  50 year old Male for health maintenance exam and evaluation of medical issues.  Patient is employed by [employer] and does a great deal of walking.  History of hypertension treated with Zestoretic 10/12.5 mg daily.  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  Social history: Does not smoke.  Drinks beer socially.  He is single.  Enjoys golf and mountain biking.  His nearest relative is his sister, Nathaniel Figueroa who lives in [place].  He has never been married and has no children.  He has a girlfriend.  No known drug allergies.  No history of operations.  He had fractured fingers as a child and a fractured ankle as a child.  Family history: Father in his 21s who has had hypertension for years.  No family history of MI or coronary disease.  No known family history of diabetes or cancer.  Mother died from liver disease as she apparently contracted hepatitis during a blood transfusion when she was 51 years old.  He has an older sister who is healthy other than history of smoking.  Saw NP July 6 th for left knee pain.Was treated with Voltaren and Baclofen. Has improved with time off work. Xrays were negative. He has returned to work and knee has started bothering him again. He has 5 different pairs of shoes. Walks about 5 miles a day.    Review of Systems has gained some weight. Was 176 pounds in Sept 2021 and BMI then was 24.55.     Objective:   Physical Exam Blood pressure 140/90 pulse 87 pulse oximetry 97% weight 189 pounds height 5 feet 11 inches BMI 26.36  Skin: Warm and dry.  Nodes none.  TMs clear.  Neck supple.  No thyromegaly.  No carotid bruits.  Chest is clear.  Cardiac exam: Regular rate and rhythm.  Abdomen is soft nondistended without hepatosplenomegaly masses or tenderness.  Prostate exam is normal.  No lower extremity  edema or deformity.  No joint effusion in his left knee.  No tenderness along ligaments or an joint line.       Assessment & Plan:   Elevated BP just took med about an hour ago. Usually 125/85.  Patient is to keep an eye on BP.  History of hypertension treated with losartan 50 mg daily.  If persistently elevated may need to increase losartan.  Persistent left knee pain-refer to [facility].  Labs including CBC c-Met lipid panel and PSA as well as urine dipstick are completely normal.  Plan: Referral to [facility] for left knee pain.  If blood pressure remains elevated, may need to increase losartan from 50 to 100 mg daily.  He should contact me if he sees that blood pressure is persistently elevated over several weeks.  Otherwise return in 1 year or as needed.  Have prescribed meloxicam 15 mg daily for left knee pain.

## 2021-06-15 ENCOUNTER — Other Ambulatory Visit: Payer: Self-pay | Admitting: Internal Medicine

## 2021-06-19 ENCOUNTER — Other Ambulatory Visit: Payer: Self-pay

## 2021-06-19 DIAGNOSIS — Z1211 Encounter for screening for malignant neoplasm of colon: Secondary | ICD-10-CM

## 2021-08-08 ENCOUNTER — Other Ambulatory Visit: Payer: Self-pay | Admitting: Internal Medicine

## 2021-09-07 ENCOUNTER — Other Ambulatory Visit: Payer: Self-pay | Admitting: Internal Medicine

## 2021-12-06 ENCOUNTER — Other Ambulatory Visit: Payer: Self-pay | Admitting: Internal Medicine

## 2022-02-09 ENCOUNTER — Encounter: Payer: Self-pay | Admitting: Internal Medicine

## 2022-04-30 ENCOUNTER — Other Ambulatory Visit: Payer: Self-pay

## 2022-04-30 MED ORDER — LOSARTAN POTASSIUM 50 MG PO TABS: ORAL_TABLET | ORAL | 1 refills | Status: DC

## 2022-06-24 ENCOUNTER — Other Ambulatory Visit: Payer: Self-pay | Admitting: Internal Medicine

## 2022-06-27 ENCOUNTER — Other Ambulatory Visit: Payer: Self-pay | Admitting: Internal Medicine

## 2022-09-15 ENCOUNTER — Ambulatory Visit
Admission: EM | Admit: 2022-09-15 | Discharge: 2022-09-15 | Disposition: A | Discharge status: Home / Self Care | Payer: BC Managed Care – PPO | Attending: Family Medicine | Admitting: Family Medicine

## 2022-09-15 DIAGNOSIS — M25562 Pain in left knee: Secondary | ICD-10-CM | POA: Diagnosis not present

## 2022-09-15 MED ORDER — DICLOFENAC SODIUM 75 MG PO TBEC: 75.0000 mg | DELAYED_RELEASE_TABLET | Freq: Two times a day (BID) | ORAL | 0 refills | Status: AC

## 2022-09-15 NOTE — ED Triage Notes (Signed)
Pt reports his left knee started popping as he is walking about a week ago. He does a lot of walking at work. No injury per pt. Took aspirin which provided slight relief.  Had a sprain about a year ago to his left leg.

## 2022-09-15 NOTE — ED Provider Notes (Signed)
[facility]    CSN: [id number] Arrival date & time: 09/15/22  1228      History   Chief Complaint No chief complaint on file.   HPI Nathaniel Figueroa is a 51 y.o. male.   HPI  Patient was here in July 2022 for knee pain.  Had some degenerative spurring on his patella.  He got better with brace and anti-inflammatories.  Followed up with [facility].  Has done well until recently.  Now has had some pressure and popping in his knee.  Increased pain.  No swelling.  No instability.  He is wearing a brace at work.  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  Past Medical History:  Diagnosis Date   Hypertension     There are no problems to display for this patient.   Past Surgical History:  Procedure Laterality Date   broke finger         Home Medications    Prior to Admission medications   Medication Sig Start Date End Date Taking? Authorizing Provider  diclofenac (VOLTAREN) 75 MG EC tablet Take 1 tablet (75 mg total) by mouth 2 (two) times daily. 09/15/22  Yes Raylene Everts, MD  losartan (COZAAR) 50 MG tablet TAKE 1 TABLET(50 MG) BY MOUTH DAILY 04/30/22   Baxley, Cresenciano Lick, MD    Family History Family History  Problem Relation Age of Onset   Hepatitis Mother    Hypertension Father    Arthritis Sister     Social History Social History   Tobacco Use   Smoking status: Former    Types: Cigarettes   Smokeless tobacco: Never   Tobacco comments:    smoked for about 5 years started at 45 and quit around 51 yrs old.   Substance Use Topics   Alcohol use: Yes    Alcohol/week: 2.0 standard drinks of alcohol    Types: 2 Cans of beer per week   Drug use: Never     Allergies   Patient has no known allergies.   Review of Systems Review of Systems See HPI  Physical Exam Triage Vital Signs ED Triage Vitals  Enc Vitals Group     BP 09/15/22 1326 (!)  138/90     Pulse Rate 09/15/22 1326 85     Resp 09/15/22 1326 20     Temp 09/15/22 1326 98.1 F (36.7 C)     Temp Source 09/15/22 1326 Oral     SpO2 09/15/22 1326 95 %     Weight 09/15/22 1333 189 lb (85.7 kg)     Height --      Head Circumference --      Peak Flow --      Pain Score 09/15/22 1333 3     Pain Loc --      Pain Edu? --      Excl. in [place]? --    No data found.  Updated Vital Signs BP (!) 138/90 (BP Location: Right Arm)   Pulse 85   Temp 98.1 F (36.7 C) (Oral)   Resp 20   Wt 85.7 kg   SpO2 95%   BMI 26.36 kg/m      Physical Exam Constitutional:      General: He is not in acute distress.    Appearance: He is well-developed.  HENT:     Head: Normocephalic and  atraumatic.  Eyes:     Conjunctiva/sclera: Conjunctivae normal.     Pupils: Pupils are equal, round, and reactive to light.  Cardiovascular:     Rate and Rhythm: Normal rate.  Pulmonary:     Effort: Pulmonary effort is normal. No respiratory distress.  Abdominal:     General: There is no distension.     Palpations: Abdomen is soft.  Musculoskeletal:        General: Normal range of motion.     Cervical back: Normal range of motion.     Comments: Patient has a click with knee extension in the patellofemoral region and tenderness along the medial border of the patella.  Pain with patellofemoral grind testing is pronounced.  No instability or joint line tenderness  Skin:    General: Skin is warm and dry.  Neurological:     Mental Status: He is alert.     Gait: Gait abnormal.      UC Treatments / Results  Labs (all labs ordered are listed, but only abnormal results are displayed) Labs Reviewed - No data to display  EKG   Radiology No results found.  Procedures Procedures (including critical care time)  Medications Ordered in UC Medications - No data to display  Initial Impression / Assessment and Plan / UC Course  I have reviewed the triage vital signs and the nursing  notes.  Pertinent labs & imaging results that were available during my care of the patient were reviewed by me and considered in my medical decision making (see chart for details).     Recommend sports medicine if fails to improve Final Clinical Impressions(s) / UC Diagnoses   Final diagnoses:  Knee pain, left anterior  Patellofemoral arthralgia of left knee     Discharge Instructions      Wear brace while at work.  This should help stabilize your kneecap Take diclofenac 2 times a day with food Ice for 20 minutes after work if knee is painful Sees Ortho medicine if you fail to improve     ED Prescriptions     Medication Sig Dispense Auth. Provider   diclofenac (VOLTAREN) 75 MG EC tablet Take 1 tablet (75 mg total) by mouth 2 (two) times daily. 60 tablet Eustace Moore, MD      PDMP not reviewed this encounter.   Eustace Moore, MD 09/15/22 [phone]

## 2022-09-15 NOTE — Discharge Instructions (Addendum)
Wear brace while at work.  This should help stabilize your kneecap Take diclofenac 2 times a day with food Ice for 20 minutes after work if knee is painful Sees Ortho medicine if you fail to improve

## 2022-10-07 ENCOUNTER — Other Ambulatory Visit: Payer: Self-pay | Admitting: Internal Medicine

## 2022-10-08 ENCOUNTER — Other Ambulatory Visit: Payer: Self-pay

## 2022-10-08 MED ORDER — LOSARTAN POTASSIUM 50 MG PO TABS: ORAL_TABLET | ORAL | 1 refills | Status: DC

## 2022-12-27 ENCOUNTER — Other Ambulatory Visit: Payer: Self-pay | Admitting: Internal Medicine

## 2022-12-27 IMAGING — DX DG KNEE COMPLETE 4+V*L*
4 series · 4 of 4 positions shown · non-contrast
Comparison: None.

CLINICAL DATA: Left knee pain and swelling for 2 weeks.

EXAM:
LEFT KNEE - COMPLETE 4+ VIEW

[knee ap]
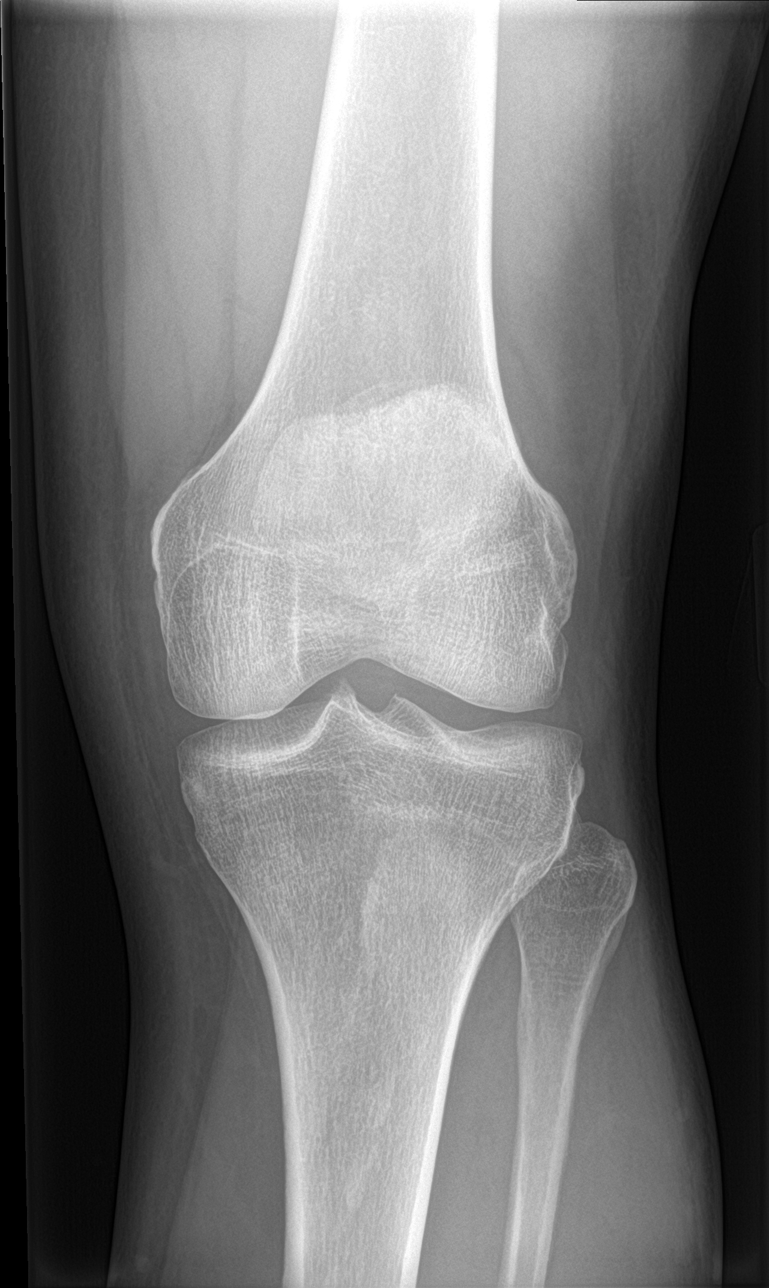

[knee lat]
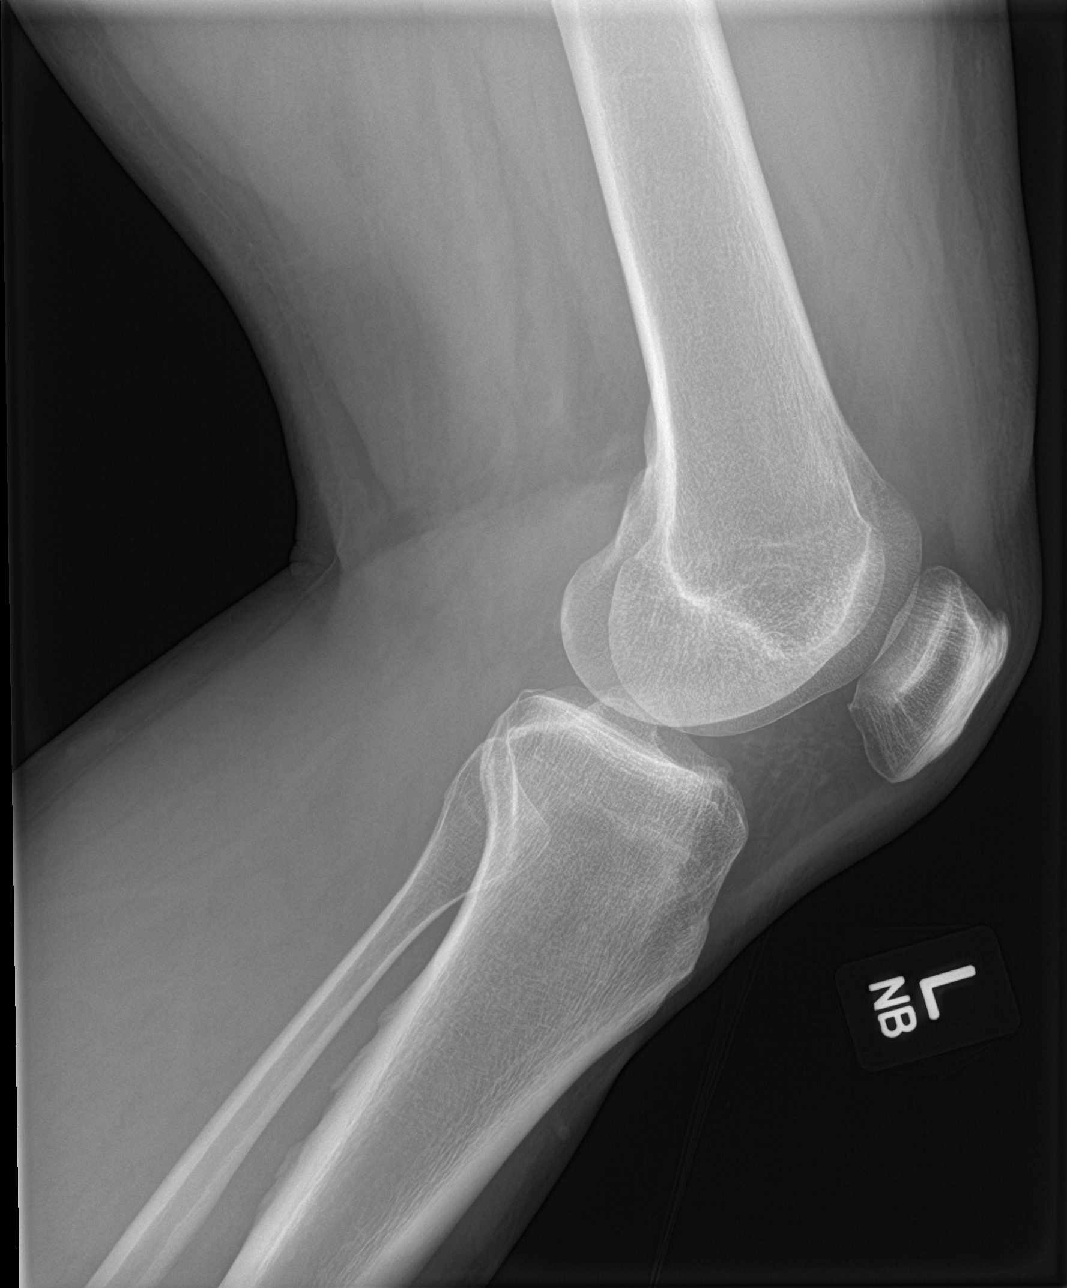

[knee obl (1 of 2)]
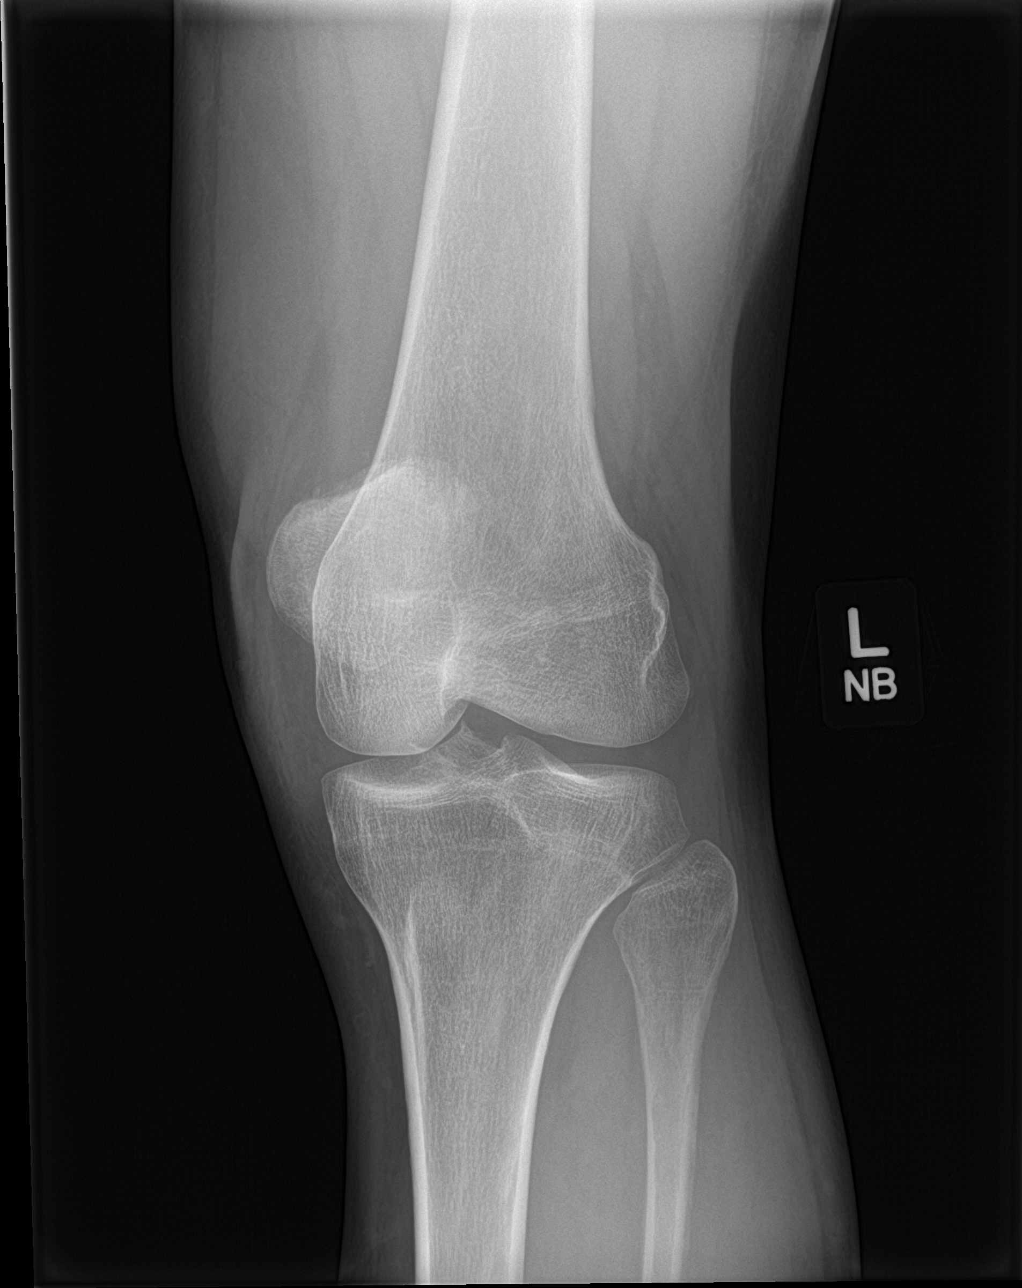

[knee obl (2 of 2)]
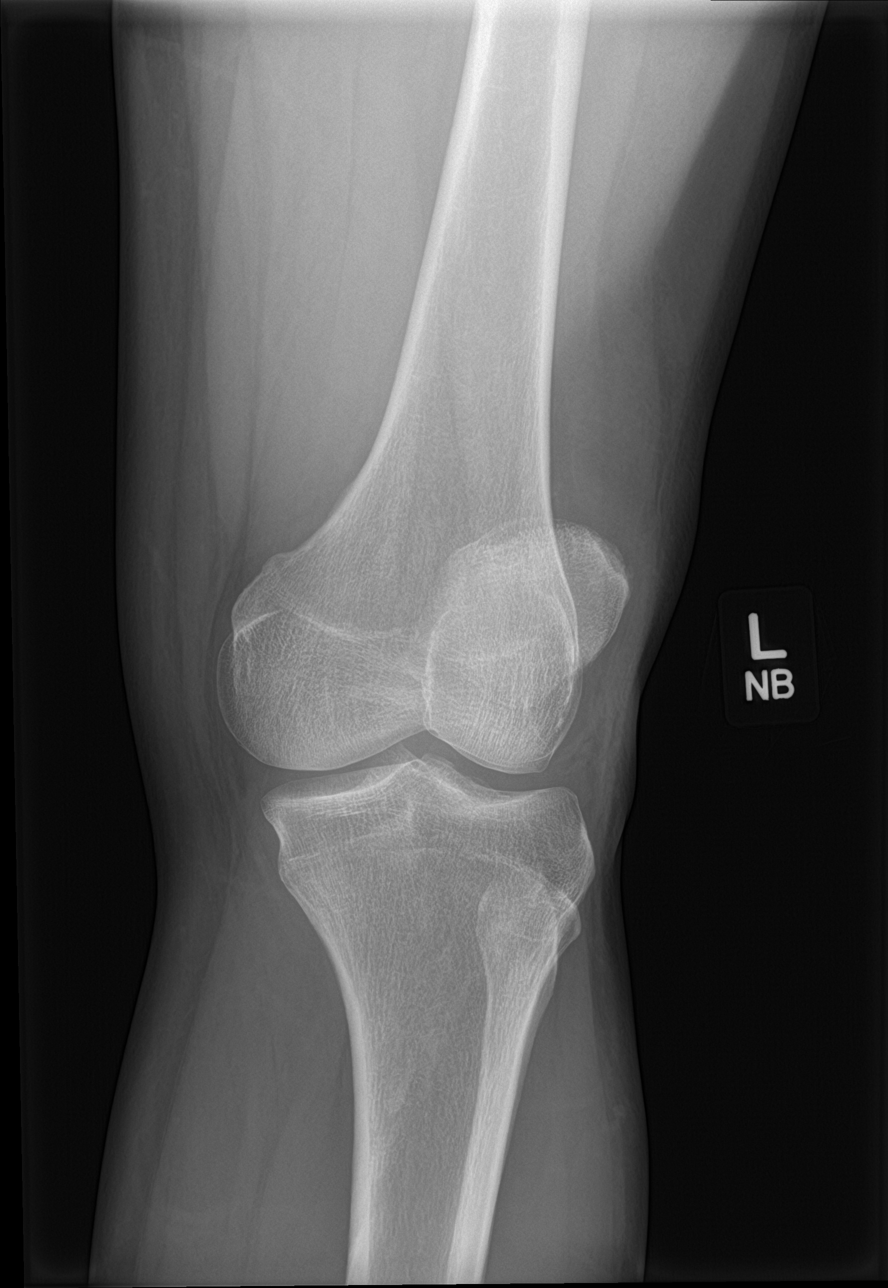

[4 of 4 positions shown; findings below may reference images not displayed]

FINDINGS: No evidence of fracture, dislocation, or joint effusion. Small
quadriceps tendon enthesophyte. Trace inferior patellar spurring and
spurring of the tibial spines. Soft tissues are unremarkable.
IMPRESSION: 1. No acute abnormality.
2. Trace degenerative inferior patellar spurring and spurring of the
tibial spines.

## 2023-04-24 ENCOUNTER — Other Ambulatory Visit: Payer: Self-pay | Admitting: Internal Medicine

## 2023-07-04 ENCOUNTER — Other Ambulatory Visit: Payer: Self-pay | Admitting: Family
